# Patient Record
Sex: Male | Born: 1985 | Hispanic: No | Marital: Single | Smoking: Never smoker
Health system: Southern US, Community
[De-identification: ages and names within clinical notes are randomized; demographics above are authoritative.]

## PROBLEM LIST (undated history)

## (undated) DIAGNOSIS — F988 Other specified behavioral and emotional disorders with onset usually occurring in childhood and adolescence: Secondary | ICD-10-CM

## (undated) DIAGNOSIS — J45909 Unspecified asthma, uncomplicated: Secondary | ICD-10-CM

## (undated) DIAGNOSIS — L409 Psoriasis, unspecified: Secondary | ICD-10-CM

---

## 2012-07-27 ENCOUNTER — Emergency Department (INDEPENDENT_AMBULATORY_CARE_PROVIDER_SITE_OTHER): Payer: Worker's Compensation

## 2012-07-27 ENCOUNTER — Emergency Department (INDEPENDENT_AMBULATORY_CARE_PROVIDER_SITE_OTHER)
Admission: EM | Admit: 2012-07-27 | Discharge: 2012-07-27 | Disposition: A | Discharge status: Home / Self Care | Payer: Worker's Compensation | Source: Home / Self Care

## 2012-07-27 ENCOUNTER — Emergency Department (HOSPITAL_COMMUNITY): Payer: Worker's Compensation

## 2012-07-27 ENCOUNTER — Encounter (HOSPITAL_COMMUNITY): Payer: Self-pay

## 2012-07-27 DIAGNOSIS — M25559 Pain in unspecified hip: Secondary | ICD-10-CM

## 2012-07-27 DIAGNOSIS — M79609 Pain in unspecified limb: Secondary | ICD-10-CM

## 2012-07-27 DIAGNOSIS — M25529 Pain in unspecified elbow: Secondary | ICD-10-CM

## 2012-07-27 DIAGNOSIS — M25521 Pain in right elbow: Secondary | ICD-10-CM

## 2012-07-27 DIAGNOSIS — M25551 Pain in right hip: Secondary | ICD-10-CM

## 2012-07-27 DIAGNOSIS — W19XXXA Unspecified fall, initial encounter: Secondary | ICD-10-CM

## 2012-07-27 MED ORDER — OXYCODONE-ACETAMINOPHEN 5-325 MG PO TABS: 1.0000 | ORAL_TABLET | Freq: Four times a day (QID) | ORAL | Status: DC | PRN

## 2012-07-27 MED ORDER — NAPROXEN 500 MG PO TABS: 500.0000 mg | ORAL_TABLET | Freq: Two times a day (BID) | ORAL | Status: DC

## 2012-07-27 NOTE — ED Provider Notes (Signed)
History     CSN: [id number]  Arrival date & time 07/27/12  1721   None     Chief Complaint  Patient presents with  . Fall    (Consider location/radiation/quality/duration/timing/severity/associated sxs/prior treatment) Patient is a 27 y.o. male presenting with fall.  Fall    Patient is a 27 year old male with no significant past medical history presented to the urgent care today after a fall from the truck.  Patient reports that he had finished putting the last donation for the [employer] in the truck, the ramp was still attached to the truck. He was closing the door of the truck when he fell off to the side as the ramp was slippery. He fell on his right side. He states that he noticed that his right forearm was swelling at the elbow area with pain. He also had pain on the right hip. He did walk on his right leg although bearing weight causes him pain. He describes pain as sharp, 8/10 in intensity in his arm.    History reviewed. No pertinent past medical history.  History reviewed. No pertinent past surgical history.  History reviewed. No pertinent family history.  History  Substance Use Topics  . Smoking status: Never Smoker   . Smokeless tobacco: Not on file  . Alcohol Use: No      Review of Systems  Constitutional: Negative.   HENT: Negative.   Eyes: Negative.   Respiratory: Negative.   Cardiovascular: Negative.   Gastrointestinal: Negative.   Genitourinary: Negative.   Musculoskeletal: Positive for joint swelling and arthralgias.       Patient complains of right forearm pain with swelling from the elbow area down towards the hand. Right hip pain radiating to the thigh.     Allergies  Review of patient's allergies indicates no known allergies.  Home Medications  No current outpatient prescriptions on file.  BP 127/80  Pulse 66  Temp(Src) 98.1 F (36.7 C) (Oral)  Resp 20  SpO2 100%  Physical Exam  Constitutional: He appears well-developed and  well-nourished.  HENT:  Head: Normocephalic.  Eyes: Pupils are equal, round, and reactive to light.  Cardiovascular: Normal rate and regular rhythm.   Pulmonary/Chest: Effort normal and breath sounds normal.  Abdominal: Soft. Bowel sounds are normal.  Musculoskeletal: He exhibits edema and tenderness.  On examination of the right arm, patient has some erythema and inflammation at the elbow area towards the right forearm. Range of movement decreased due to pain on flexion and extension at elbow joint. Right wrist joint appears to be normal.  Also complains of pain in right hip on flexion and internal rotation. Left arm, elbow joints are WNL. Left hip, knee joints are WNL.       ED Course  Procedures (including critical care time)  Labs Reviewed - No data to display Dg Elbow Complete Right  07/27/2012   *RADIOLOGY REPORT*  Clinical Data: Status post fall with a blow to the right elbow. Pain.  RIGHT ELBOW - COMPLETE 3+ VIEW  Comparison: None.  Findings: Imaged bones, joints and soft tissues appear normal.  IMPRESSION: Negative exam.   Original Report Authenticated By: Holley Dexter, M.D.   Dg Hip Complete Right  07/27/2012   *RADIOLOGY REPORT*  Clinical Data: Fall.  RIGHT HIP - COMPLETE 2+ VIEW  Comparison: None  Findings: No acute bony abnormality.  Specifically, no fracture, subluxation, or dislocation.  Soft tissues are intact.  Hip joints and SI joints are symmetric and unremarkable.  IMPRESSION:  No bony abnormality.   Original Report Authenticated By: Charlett Nose, M.D.     1. Forearm pain, right   2. Right elbow pain   3. Acute right hip pain   4. Fall, initial encounter       MDM  Ordered right elbow x-rays- showed no acute bony normality Ordered right hip x-rays- no fracture or dislocation Occupational therapy to determine restrictions - the patient advised to followup with occupational therapy in a.m. tomorrow to determine restrictions Prescribed Naprosyn and pain control,  percocet 325/5mg  q6hours PRN (#30, no refills)   Demareon Coldwell M.D. [facility] 07/27/2012, 8:15 PM  Pager: [phone]           Cathren Harsh, MD 07/27/12 2015

## 2012-07-27 NOTE — ED Notes (Addendum)
WC injury; states he was loading a donation onto the back of a truck, when his feet slipped on wet metal surface, states  fell aprox 4 feet onto side on pavement . Denies LOC, but has pain on right shoulder, right hip. NAD. Attempted to contact manager on cell phone to check on post accident screen testing requirements, left message.

## 2016-01-09 ENCOUNTER — Emergency Department (HOSPITAL_BASED_OUTPATIENT_CLINIC_OR_DEPARTMENT_OTHER): Payer: Self-pay

## 2016-01-09 ENCOUNTER — Encounter (HOSPITAL_BASED_OUTPATIENT_CLINIC_OR_DEPARTMENT_OTHER): Payer: Self-pay

## 2016-01-09 ENCOUNTER — Emergency Department (HOSPITAL_BASED_OUTPATIENT_CLINIC_OR_DEPARTMENT_OTHER)
Admission: EM | Admit: 2016-01-09 | Discharge: 2016-01-09 | Disposition: A | Discharge status: Home / Self Care | Payer: Self-pay | Attending: Emergency Medicine | Admitting: Emergency Medicine

## 2016-01-09 DIAGNOSIS — Z79899 Other long term (current) drug therapy: Secondary | ICD-10-CM | POA: Insufficient documentation

## 2016-01-09 DIAGNOSIS — J45909 Unspecified asthma, uncomplicated: Secondary | ICD-10-CM | POA: Insufficient documentation

## 2016-01-09 DIAGNOSIS — F909 Attention-deficit hyperactivity disorder, unspecified type: Secondary | ICD-10-CM | POA: Insufficient documentation

## 2016-01-09 DIAGNOSIS — L03115 Cellulitis of right lower limb: Secondary | ICD-10-CM | POA: Insufficient documentation

## 2016-01-09 HISTORY — DX: Other specified behavioral and emotional disorders with onset usually occurring in childhood and adolescence: F98.8

## 2016-01-09 HISTORY — DX: Unspecified asthma, uncomplicated: J45.909

## 2016-01-09 HISTORY — DX: Psoriasis, unspecified: L40.9

## 2016-01-09 MED ORDER — HYDROCODONE-ACETAMINOPHEN 5-325 MG PO TABS: 1.0000 | ORAL_TABLET | Freq: Four times a day (QID) | ORAL | 0 refills | Status: AC | PRN

## 2016-01-09 MED ORDER — HYDROCODONE-ACETAMINOPHEN 5-325 MG PO TABS
1.0000 | ORAL_TABLET | Freq: Once | ORAL | Status: AC
  Administered 2016-01-09: 1 via ORAL
  Filled 2016-01-09: qty 1

## 2016-01-09 MED ORDER — SULFAMETHOXAZOLE-TRIMETHOPRIM 800-160 MG PO TABS: 1.0000 | ORAL_TABLET | Freq: Two times a day (BID) | ORAL | 0 refills | Status: AC

## 2016-01-09 MED ORDER — CEPHALEXIN 500 MG PO CAPS: ORAL_CAPSULE | ORAL | 0 refills | Status: AC

## 2016-01-09 MED ORDER — NAPROXEN 500 MG PO TABS: 500.0000 mg | ORAL_TABLET | Freq: Two times a day (BID) | ORAL | 0 refills | Status: AC | PRN

## 2016-01-09 NOTE — ED Provider Notes (Signed)
[facility] Provider Note   CSN: [id number] Arrival date & time: 01/09/16  2022  By signing my name below, I, [name], attest that this documentation has been prepared under the direction and in the presence of  Linde Wilensky Camprubi-Soms, PA-C. Electronically Signed: [name], ED Scribe. 01/09/16. 8:55 PM.  History   Chief Complaint Chief Complaint  Patient presents with  . Leg Swelling   The history is provided by the patient and medical records. No language interpreter was used.  Leg Pain   This is a new problem. The current episode started 12 to 24 hours ago. The problem occurs constantly. The problem has not changed since onset.The pain is present in the right lower leg. Quality: throbbing. The pain is at a severity of 9/10. The pain is moderate. Pertinent negatives include no numbness and no tingling. The symptoms are aggravated by contact. He has tried OTC pain medications for the symptoms. The treatment provided no relief.    HPI Comments:  George Craig is a 30 y.o. male with a PMHx of psoriasis and asthma, who presents to the Emergency Department complaining of right lower leg pain that began today. He has psoriasis and has been treating it with hydrocortisone for a few months but today he developed pain and swelling, redness, warmth, and fever with Tmax 102.3  He describes his RLE pain as 9/10 constant and throbbing nonradiating RLE pain, which is worse with walking, and unrelieved by tylenol. He denies URI symptoms, rhinorrhea, sore throat, congestion, ear pain/drainage, cough, hemoptysis, chest pain, SOB, abd pain, nausea, vomiting, diarrhea, constipation, dysuria, hematuria, numbness, tingling, focal weakness, and purulent drainage.  He also denies Hx or FMHx of DVT/PE. Denies recent prolonged immobilization or surgery.  He did go on an 8 hour car trip recently but made frequent stops every 45 minutes to stop and walk around.     Past  Medical History:  Diagnosis Date  . ADD (attention deficit disorder)   . Asthma   . Psoriasis     There are no active problems to display for this patient.   History reviewed. No pertinent surgical history.    Home Medications    Prior to Admission medications   Medication Sig Start Date End Date Taking? Authorizing Provider  Amphetamine-Dextroamphetamine (ADDERALL PO) Take by mouth.   Yes Historical Provider, MD  hydrocortisone cream 0.5 % Apply 1 application topically 2 (two) times daily.   Yes Historical Provider, MD    Family History No family history on file.  Social History Social History  Substance Use Topics  . Smoking status: Never Smoker  . Smokeless tobacco: Never Used  . Alcohol use No     Allergies   Review of patient's allergies indicates no known allergies.   Review of Systems Review of Systems  Constitutional: Positive for fever.  HENT: Negative for ear discharge, ear pain, rhinorrhea and sore throat.   Respiratory: Negative for cough and shortness of breath.   Cardiovascular: Positive for leg swelling (RLE). Negative for chest pain.  Gastrointestinal: Negative for abdominal pain, constipation, diarrhea, nausea and vomiting.  Genitourinary: Negative for dysuria and hematuria.  Musculoskeletal: Positive for arthralgias (RLE).  Skin: Positive for color change.  Allergic/Immunologic: Negative for immunocompromised state.  Neurological: Negative for tingling, weakness and numbness.  Psychiatric/Behavioral: Negative for confusion.    10 systems reviewed and all are negative for acute change except as noted in the HPI.  Physical Exam Updated Vital Signs BP (!) 131/105 (BP Location: Left  Arm)   Pulse 112   Temp 98.4 F (36.9 C) (Oral)   Resp 20   Ht 5\' 7"  (1.702 m)   Wt 266 lb (120.7 kg)   SpO2 98%   BMI 41.66 kg/m   Physical Exam  Constitutional: He is oriented to person, place, and time. Vital signs are normal. He appears well-developed  and well-nourished.  Non-toxic appearance. No distress.  Afebrile, nontoxic, NAD  HENT:  Head: Normocephalic and atraumatic.  Mouth/Throat: Mucous membranes are normal.  Eyes: Conjunctivae and EOM are normal. Right eye exhibits no discharge. Left eye exhibits no discharge.  Neck: Normal range of motion. Neck supple.  Cardiovascular: Normal rate, regular rhythm, S1 normal, S2 normal, normal heart sounds and intact distal pulses.  Exam reveals no gallop and no friction rub.   No murmur heard. Heart rate 92.  RRR, nl s1/s2, no m/r/g, distal pulses intact, with 1+ bilateral pedal edema.  Pulmonary/Chest: Effort normal. No respiratory distress.  Abdominal: Normal appearance. He exhibits no distension.  Musculoskeletal: Normal range of motion.  See skin exam.   Neurological: He is alert and oriented to person, place, and time. He has normal strength. No sensory deficit.  Skin: Skin is warm, dry and intact. No rash noted. There is erythema.  Right lower leg with psoriatic lesions and some excoriations to distal leg, with mild erythema, induration, and TTP to the anterior lower leg near the psoriatic lesions, no definite abscess, no drainage or weeping, with 1+ bilateral pitting edema, negative holman's bilaterally. Distal pulses intact, soft compartments, strength and sensation grossly intact.    Psychiatric: He has a normal mood and affect.  Nursing note and vitals reviewed.    ED Treatments / Results   DIAGNOSTIC STUDIES:  Oxygen Saturation is 98% on RA, NML by my interpretation.    COORDINATION OF CARE:  8:55 PM Discussed treatment plan with pt at bedside and pt agreed to plan.  Labs (all labs ordered are listed, but only abnormal results are displayed) Labs Reviewed - No data to display  EKG  EKG Interpretation None       Radiology US Venous Img Lower Unilateral Right  Result Date: 01/09/2016 CLINICAL DATA:  30 year old male with right foot and right knee pain and  swelling. EXAM: Right LOWER EXTREMITY VENOUS DOPPLER ULTRASOUND TECHNIQUE: Gray-scale sonography with graded compression, as well as color Doppler and duplex ultrasound were performed to evaluate the lower extremity deep venous systems from the level of the common femoral vein and including the common femoral, femoral, profunda femoral, popliteal and calf veins including the posterior tibial, peroneal and gastrocnemius veins when visible. The superficial great saphenous vein was also interrogated. Spectral Doppler was utilized to evaluate flow at rest and with distal augmentation maneuvers in the common femoral, femoral and popliteal veins. COMPARISON:  None. FINDINGS: Contralateral Common Femoral Vein: Respiratory phasicity is normal and symmetric with the symptomatic side. No evidence of thrombus. Normal compressibility. Common Femoral Vein: No evidence of thrombus. Normal compressibility, respiratory phasicity and response to augmentation. Saphenofemoral Junction: No evidence of thrombus. Normal compressibility and flow on color Doppler imaging. Profunda Femoral Vein: No evidence of thrombus. Normal compressibility and flow on color Doppler imaging. Femoral Vein: No evidence of thrombus. Normal compressibility, respiratory phasicity and response to augmentation. Popliteal Vein: No evidence of thrombus. Normal compressibility, respiratory phasicity and response to augmentation. Calf Veins: No evidence of thrombus. Normal compressibility and flow on color Doppler imaging. The peroneal vein is not well visualized due to body  habitus. Superficial Great Saphenous Vein: No evidence of thrombus. Normal compressibility and flow on color Doppler imaging. Venous Reflux:  None. Other Findings:  None. IMPRESSION: No evidence of deep venous thrombosis in the right lower extremity. Electronically Signed   By: Elgie Collard M.D.   On: 01/09/2016 21:36    Procedures Procedures (including critical care time)  Medications  Ordered in ED Medications  HYDROcodone-acetaminophen (NORCO/VICODIN) 5-325 MG per tablet 1 tablet (1 tablet Oral Given 01/09/16 2054)     Initial Impression / Assessment and Plan / ED Course  I have reviewed the triage vital signs and the nursing notes.  Pertinent labs & imaging results that were available during my care of the patient were reviewed by me and considered in my medical decision making (see chart for details).  Clinical Course    30 y.o. male here with RLE swelling, pain, redness, warmth and fever that began today. Has psoriasis and has been treating it for a few months with hydrocortisone. On exam, RLE with mild swelling but not significantly larger than left, neg homan's, mild warmth and induration and TTP to area around psoriatic lesion, c/w cellulitis. HR documented as 112 but on exam it's 92. No CP/SOB, doubt PE. Will obtain U/S to ensure no DVT. Will give pain meds and reassess shortly.   9:47 PM U/S neg for DVT. Will send home with keflex/bactrim to treat for cellulitis. Discussed f/up with [facility] in 2-3 days for recheck, and with [facility] in 1wk to establish medical care and recheck on symptoms. Pain meds given. Discussed elevation and warmth to help with pain. I explained the diagnosis and have given explicit precautions to return to the ER including for any other new or worsening symptoms. The patient understands and accepts the medical plan as it's been dictated and I have answered their questions. Discharge instructions concerning home care and prescriptions have been given. The patient is STABLE and is discharged to home in good condition.   Final Clinical Impressions(s) / ED Diagnoses   Final diagnoses:  Cellulitis of right leg    New Prescriptions New Prescriptions   CEPHALEXIN (KEFLEX) 500 MG CAPSULE    2 caps po bid x 7 days   HYDROCODONE-ACETAMINOPHEN (NORCO) 5-325 MG TABLET    Take 1 tablet by mouth every 6 (six) hours as needed for severe pain.   NAPROXEN  (NAPROSYN) 500 MG TABLET    Take 1 tablet (500 mg total) by mouth 2 (two) times daily as needed for mild pain, moderate pain or headache (TAKE WITH MEALS.).   SULFAMETHOXAZOLE-TRIMETHOPRIM (BACTRIM DS,SEPTRA DS) 800-160 MG TABLET    Take 1 tablet by mouth 2 (two) times daily.   I personally performed the services described in this documentation, which was scribed in my presence. The recorded information has been reviewed and is accurate.    France Ravens Camprubi-Soms, PA-C 01/09/16 2148    Laurence Spates, MD 01/14/16 [phone]

## 2016-01-09 NOTE — ED Triage Notes (Signed)
C/o right LE swelling and pain, fever x today-NAD-steady gait

## 2016-01-09 NOTE — Discharge Instructions (Signed)
Keep wounds clean and dry. Apply warm compresses to affected area throughout the day. Elevate your leg to help with pain/swelling. Take antibiotics until it is finished. Take naprosyn or norco as directed, as needed for pain but do not drive or operate machinery with pain medication use. [facility] and with [facility] in 1 week to recheck symptoms and establish medical care, and for ongoing management of your leg wound/pain. Monitor area for signs of infection to include, but not limited to: increasing pain, spreading redness, drainage/pus, worsening swelling, or fevers. Return to emergency department for emergent changing or worsening symptoms.

## 2016-01-09 NOTE — ED Notes (Addendum)
C/o pain to rt lower leg pain onset this  Am, leg is red and ? Swollen has psoriasis on bilateral legs and ars,  States did drive to dc 2 days ago but stopped every 45 minutes

## 2018-05-09 IMAGING — US US EXTREM LOW VENOUS*R*
1 series · 13 of 24 positions shown · non-contrast
Comparison: None.

CLINICAL DATA: 30-year-old male with right foot and right knee pain
and swelling.



[Series 1: us extrem low venous*right* · 0.09mm/px · 13 of 34 slices shown]
[im 1/34]
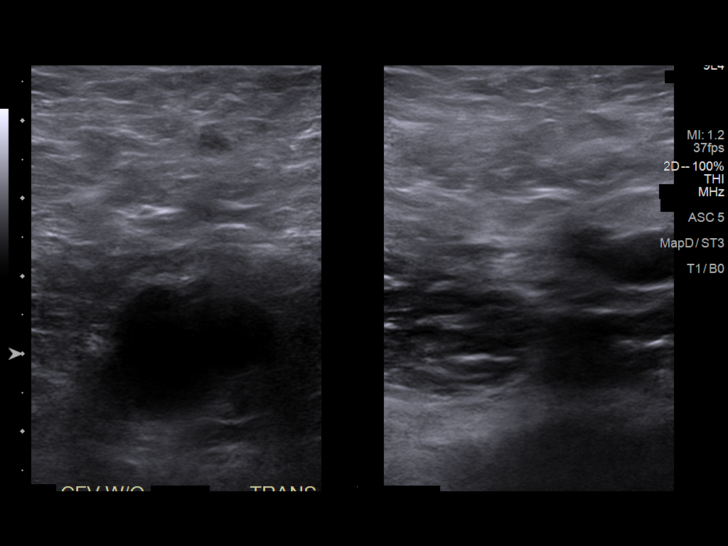
[im 3/34]
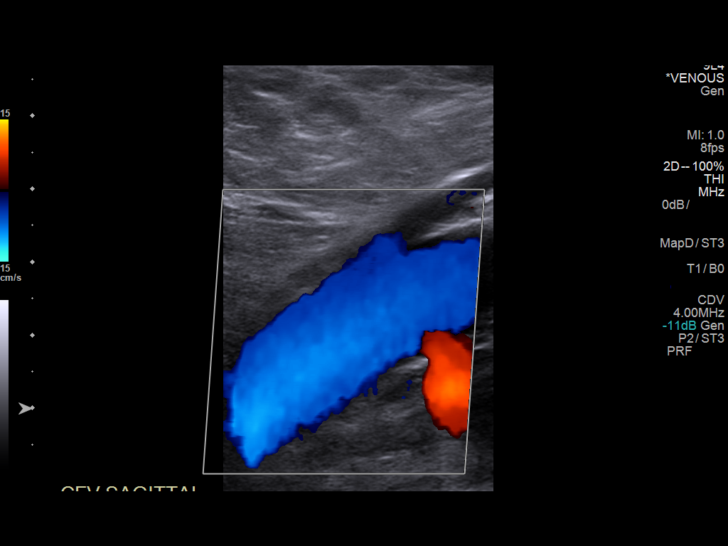
[im 6/34]
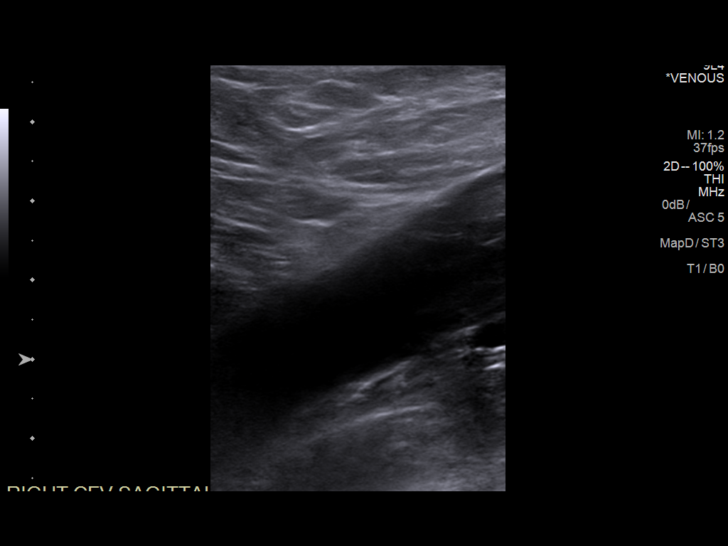
[im 9/34]
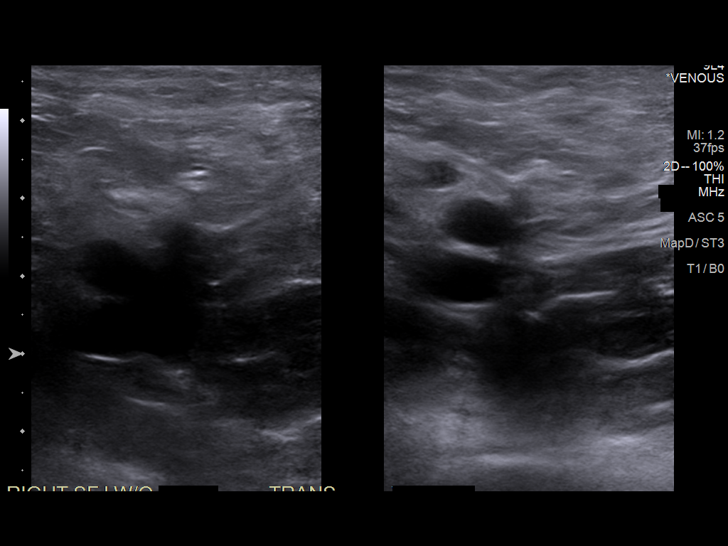
[im 12/34]
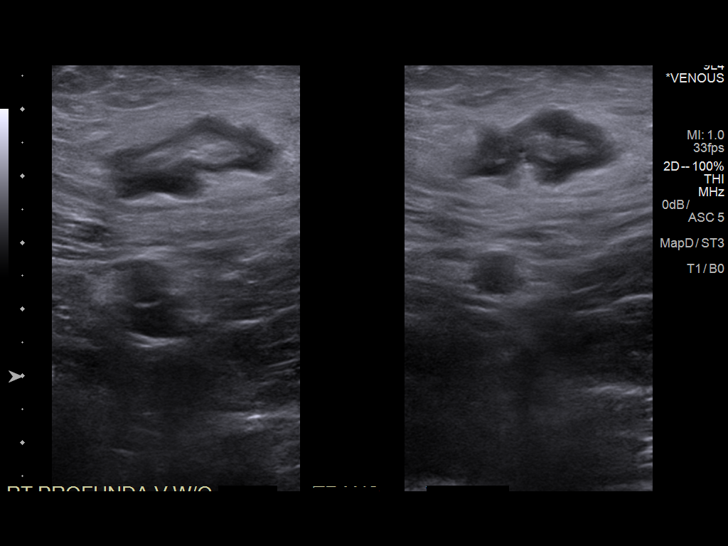
[im 15/34]
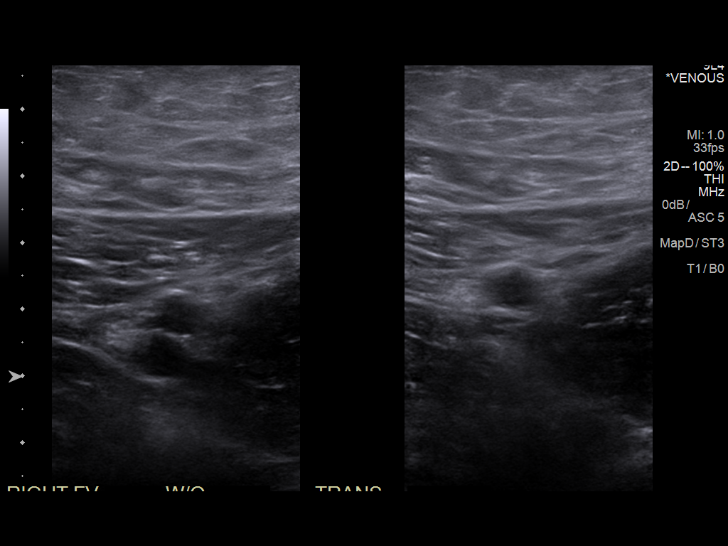
[im 18/34]
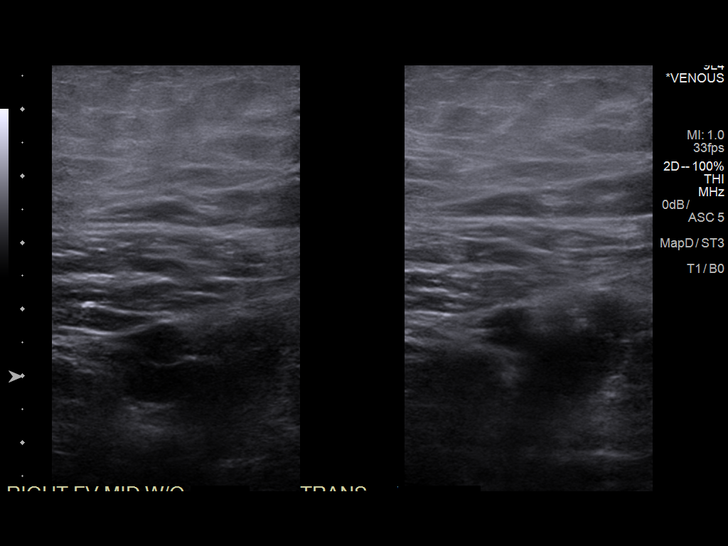
[im 19/34]
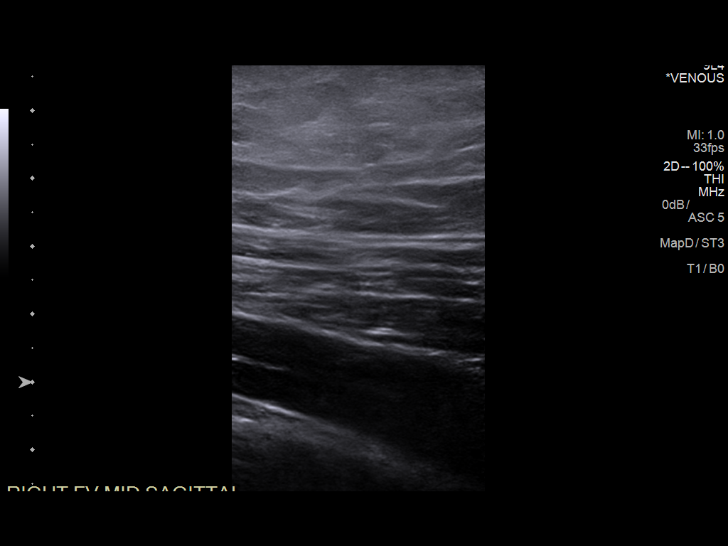
[im 22/34]
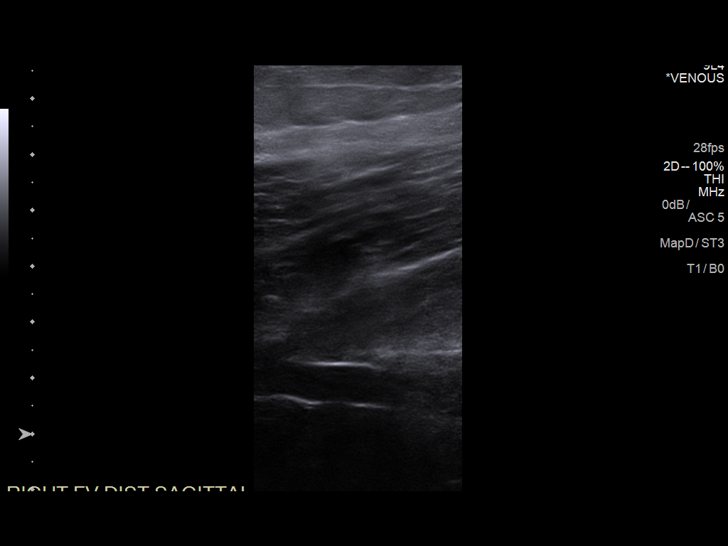
[im 25/34]
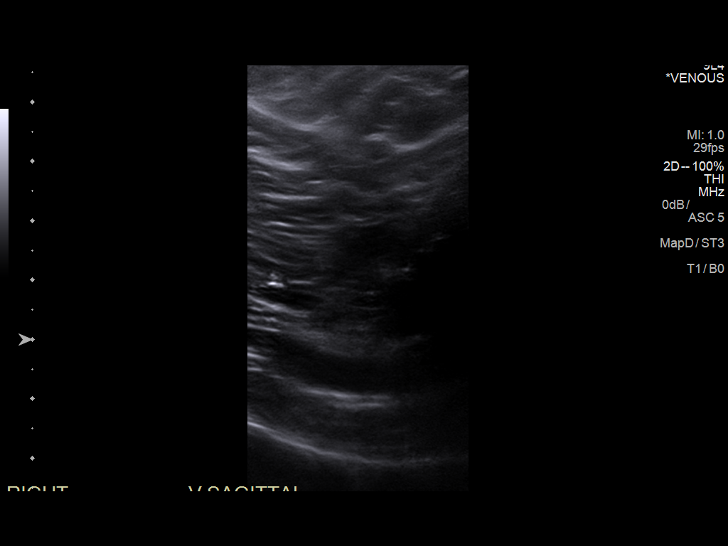
[im 28/34]
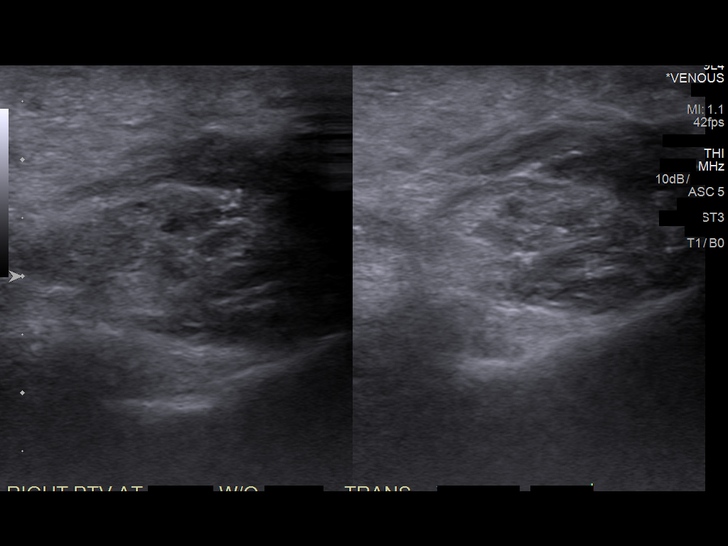
[im 31/34]
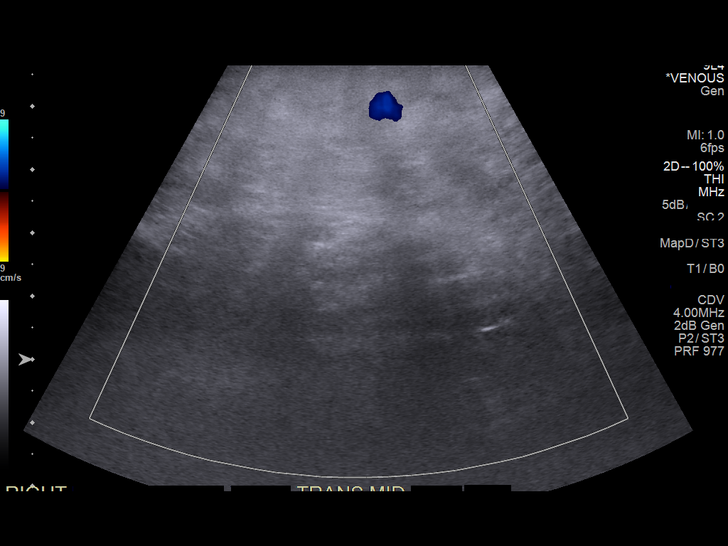
[im 34/34]
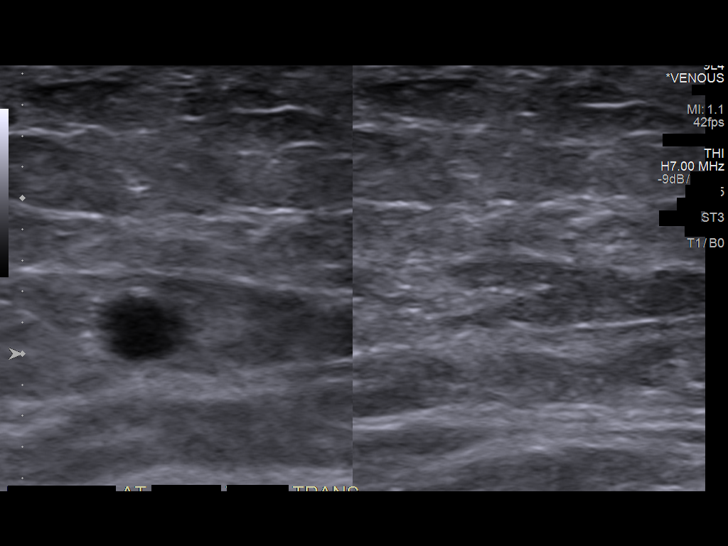

[13 of 24 positions shown; findings below may reference images not displayed]

FINDINGS: Contralateral Common Femoral Vein: Respiratory phasicity is normal
and symmetric with the symptomatic side. No evidence of thrombus.
Normal compressibility.

Common Femoral Vein: No evidence of thrombus. Normal
compressibility, respiratory phasicity and response to augmentation.

Saphenofemoral Junction: No evidence of thrombus. Normal
compressibility and flow on color Doppler imaging.

Profunda Femoral Vein: No evidence of thrombus. Normal
compressibility and flow on color Doppler imaging.

Femoral Vein: No evidence of thrombus. Normal compressibility,
respiratory phasicity and response to augmentation.

Popliteal Vein: No evidence of thrombus. Normal compressibility,
respiratory phasicity and response to augmentation.

Calf Veins: No evidence of thrombus. Normal compressibility and flow
on color Doppler imaging. The peroneal vein is not well visualized
due to body habitus.

Superficial Great Saphenous Vein: No evidence of thrombus. Normal
compressibility and flow on color Doppler imaging.

Venous Reflux:  None.

Other Findings:  None.
IMPRESSION: No evidence of deep venous thrombosis in the right lower extremity.
